# Patient Record
Sex: Female | Born: 1960 | Race: White | Hispanic: No | Marital: Married | Smoking: Former smoker
Health system: Southern US, Community
[De-identification: ages and names within clinical notes are randomized; demographics above are authoritative.]

## PROBLEM LIST (undated history)

## (undated) DIAGNOSIS — I4891 Unspecified atrial fibrillation: Secondary | ICD-10-CM

## (undated) HISTORY — PX: CHOLECYSTECTOMY: SHX55

---

## 2014-08-13 ENCOUNTER — Emergency Department (HOSPITAL_COMMUNITY)
Admission: EM | Admit: 2014-08-13 | Discharge: 2014-08-13 | Disposition: A | Discharge status: Home / Self Care | Payer: Self-pay | Attending: Emergency Medicine | Admitting: Emergency Medicine

## 2014-08-13 ENCOUNTER — Emergency Department (HOSPITAL_COMMUNITY): Payer: Self-pay

## 2014-08-13 ENCOUNTER — Encounter (HOSPITAL_COMMUNITY): Payer: Self-pay

## 2014-08-13 DIAGNOSIS — Z87891 Personal history of nicotine dependence: Secondary | ICD-10-CM | POA: Insufficient documentation

## 2014-08-13 DIAGNOSIS — I4891 Unspecified atrial fibrillation: Secondary | ICD-10-CM | POA: Insufficient documentation

## 2014-08-13 DIAGNOSIS — Z7982 Long term (current) use of aspirin: Secondary | ICD-10-CM | POA: Insufficient documentation

## 2014-08-13 DIAGNOSIS — Z79899 Other long term (current) drug therapy: Secondary | ICD-10-CM | POA: Insufficient documentation

## 2014-08-13 HISTORY — DX: Unspecified atrial fibrillation: I48.91

## 2014-08-13 LAB — CBC WITH DIFFERENTIAL/PLATELET
Basophils Absolute: 0 10*3/uL (ref 0.0–0.1)
Basophils Relative: 1 % (ref 0–1)
EOS ABS: 0.2 10*3/uL (ref 0.0–0.7)
EOS PCT: 3 % (ref 0–5)
HCT: 43.5 % (ref 36.0–46.0)
Hemoglobin: 14.3 g/dL (ref 12.0–15.0)
LYMPHS ABS: 4 10*3/uL (ref 0.7–4.0)
LYMPHS PCT: 55 % — AB (ref 12–46)
MCH: 30.5 pg (ref 26.0–34.0)
MCHC: 32.9 g/dL (ref 30.0–36.0)
MCV: 92.8 fL (ref 78.0–100.0)
MONO ABS: 0.5 10*3/uL (ref 0.1–1.0)
MONOS PCT: 7 % (ref 3–12)
Neutro Abs: 2.4 10*3/uL (ref 1.7–7.7)
Neutrophils Relative %: 34 % — ABNORMAL LOW (ref 43–77)
Platelets: 310 10*3/uL (ref 150–400)
RBC: 4.69 MIL/uL (ref 3.87–5.11)
RDW: 13.6 % (ref 11.5–15.5)
WBC: 7.2 10*3/uL (ref 4.0–10.5)

## 2014-08-13 LAB — COMPREHENSIVE METABOLIC PANEL
ALT: 26 U/L (ref 14–54)
AST: 38 U/L (ref 15–41)
Albumin: 3.6 g/dL (ref 3.5–5.0)
Alkaline Phosphatase: 77 U/L (ref 38–126)
Anion gap: 6 (ref 5–15)
BUN: 10 mg/dL (ref 6–20)
CHLORIDE: 105 mmol/L (ref 101–111)
CO2: 29 mmol/L (ref 22–32)
CREATININE: 0.86 mg/dL (ref 0.44–1.00)
Calcium: 9.2 mg/dL (ref 8.9–10.3)
GFR calc Af Amer: >60 mL/min (ref >60)
Glucose, Bld: 105 mg/dL — ABNORMAL HIGH (ref 65–99)
Potassium: 4.4 mmol/L (ref 3.5–5.1)
Sodium: 140 mmol/L (ref 135–145)
Total Bilirubin: 0.7 mg/dL (ref 0.3–1.2)
Total Protein: 6.7 g/dL (ref 6.5–8.1)

## 2014-08-13 LAB — TROPONIN I: Troponin I: <0.03 ng/mL (ref <0.031)

## 2014-08-13 MED ORDER — SODIUM CHLORIDE 0.9 % IV BOLUS (SEPSIS)
500.0000 mL | Freq: Once | INTRAVENOUS | Status: AC
  Administered 2014-08-13: 500 mL via INTRAVENOUS

## 2014-08-13 MED ORDER — METOPROLOL TARTRATE 1 MG/ML IV SOLN
5.0000 mg | Freq: Once | INTRAVENOUS | Status: AC
  Administered 2014-08-13: 5 mg via INTRAVENOUS
  Filled 2014-08-13: qty 5

## 2014-08-13 NOTE — Discharge Instructions (Signed)
Follow-up with your cardiologist in the next 3 days. Take atenolol 50 mg twice daily. Return to the ER with any worsening of symptoms, severe chest pain, shortness of breath, lightheadedness or if you feel your going to pass out.  Atrial Fibrillation Atrial fibrillation is a type of irregular heart rhythm (arrhythmia). During atrial fibrillation, the upper chambers of the heart (atria) quiver continuously in a chaotic pattern. This causes an irregular and often rapid heart rate.  Atrial fibrillation is the result of the heart becoming overloaded with disorganized signals that tell it to beat. These signals are normally released one at a time by a part of the right atrium called the sinoatrial node. They then travel from the atria to the lower chambers of the heart (ventricles), causing the atria and ventricles to contract and pump blood as they pass. In atrial fibrillation, parts of the atria outside of the sinoatrial node also release these signals. This results in two problems. First, the atria receive so many signals that they do not have time to fully contract. Second, the ventricles, which can only receive one signal at a time, beat irregularly and out of rhythm with the atria.  There are three types of atrial fibrillation:   Paroxysmal. Paroxysmal atrial fibrillation starts suddenly and stops on its own within a week.  Persistent. Persistent atrial fibrillation lasts for more than a week. It may stop on its own or with treatment.  Permanent. Permanent atrial fibrillation does not go away. Episodes of atrial fibrillation may lead to permanent atrial fibrillation. Atrial fibrillation can prevent your heart from pumping blood normally. It increases your risk of stroke and can lead to heart failure.  CAUSES   Heart conditions, including a heart attack, heart failure, coronary artery disease, and heart valve conditions.   Inflammation of the sac that surrounds the heart (pericarditis).  Blockage  of an artery in the lungs (pulmonary embolism).  Pneumonia or other infections.  Chronic lung disease.  Thyroid problems, especially if the thyroid is overactive (hyperthyroidism).  Caffeine, excessive alcohol use, and use of some illegal drugs.   Use of some medicines, including certain decongestants and diet pills.  Heart surgery.   Birth defects.  Sometimes, no cause can be found. When this happens, the atrial fibrillation is called lone atrial fibrillation. The risk of complications from atrial fibrillation increases if you have lone atrial fibrillation and you are age 37 years or older. RISK FACTORS  Heart failure.  Coronary artery disease.  Diabetes mellitus.   High blood pressure (hypertension).   Obesity.   Other arrhythmias.   Increased age. SIGNS AND SYMPTOMS   A feeling that your heart is beating rapidly or irregularly.   A feeling of discomfort or pain in your chest.   Shortness of breath.   Sudden light-headedness or weakness.   Getting tired easily when exercising.   Urinating more often than normal (mainly when atrial fibrillation first begins).  In paroxysmal atrial fibrillation, symptoms may start and suddenly stop. DIAGNOSIS  Your health care provider may be able to detect atrial fibrillation when taking your pulse. Your health care provider may have you take a test called an ambulatory electrocardiogram (ECG). An ECG records your heartbeat patterns over a 24-hour period. You may also have other tests, such as:  Transthoracic echocardiogram (TTE). During echocardiography, sound waves are used to evaluate how blood flows through your heart.  Transesophageal echocardiogram (TEE).  Stress test. There is more than one type of stress test. If a  stress test is needed, ask your health care provider about which type is best for you.  Chest X-ray exam.  Blood tests.  Computed tomography (CT). TREATMENT  Treatment may  include:  Treating any underlying conditions. For example, if you have an overactive thyroid, treating the condition may correct atrial fibrillation.  Taking medicine. Medicines may be given to control a rapid heart rate or to prevent blood clots, heart failure, or a stroke.  Having a procedure to correct the rhythm of the heart:  Electrical cardioversion. During electrical cardioversion, a controlled, low-energy shock is delivered to the heart through your skin. If you have chest pain, very low blood pressure, or sudden heart failure, this procedure may need to be done as an emergency.  Catheter ablation. During this procedure, heart tissues that send the signals that cause atrial fibrillation are destroyed.  Surgical ablation. During this surgery, thin lines of heart tissue that carry the abnormal signals are destroyed. This procedure can either be an open-heart surgery or a minimally invasive surgery. With the minimally invasive surgery, small cuts are made to access the heart instead of a large opening.  Pulmonary venous isolation. During this surgery, tissue around the veins that carry blood from the lungs (pulmonary veins) is destroyed. This tissue is thought to carry the abnormal signals. HOME CARE INSTRUCTIONS   Take medicines only as directed by your health care provider. Some medicines can make atrial fibrillation worse or recur.  If blood thinners were prescribed by your health care provider, take them exactly as directed. Too much blood-thinning medicine can cause bleeding. If you take too little, you will not have the needed protection against stroke and other problems.  Perform blood tests at home if directed by your health care provider. Perform blood tests exactly as directed.  Quit smoking if you smoke.  Do not drink alcohol.  Do not drink caffeinated beverages such as coffee, soda, and some teas. You may drink decaffeinated coffee, soda, or tea.   Maintain a healthy  weight.Do not use diet pills unless your health care provider approves. They may make heart problems worse.   Follow diet instructions as directed by your health care provider.  Exercise regularly as directed by your health care provider.  Keep all follow-up visits as directed by your health care provider. This is important. PREVENTION  The following substances can cause atrial fibrillation to recur:   Caffeinated beverages.  Alcohol.  Certain medicines, especially those used for breathing problems.  Certain herbs and herbal medicines, such as those containing ephedra or ginseng.  Illegal drugs, such as cocaine and amphetamines. Sometimes medicines are given to prevent atrial fibrillation from recurring. Proper treatment of any underlying condition is also important in helping prevent recurrence.  SEEK MEDICAL CARE IF:  You notice a change in the rate, rhythm, or strength of your heartbeat.  You suddenly begin urinating more frequently.  You tire more easily when exerting yourself or exercising. SEEK IMMEDIATE MEDICAL CARE IF:   You have chest pain, abdominal pain, sweating, or weakness.  You feel nauseous.  You have shortness of breath.  You suddenly have swollen feet and ankles.  You feel dizzy.  Your face or limbs feel numb or weak.  You have a change in your vision or speech. MAKE SURE YOU:   Understand these instructions.  Will watch your condition.  Will get help right away if you are not doing well or get worse. Document Released: 12/30/2004 Document Revised: 05/16/2013 Document Reviewed: 02/10/2012  [employer]® Patient Information ©2015 [employer]. This information is not intended to replace advice given to you by your health care provider. Make sure you discuss any questions you have with your health care provider. ° °

## 2014-08-13 NOTE — ED Notes (Signed)
Pt c/o irregular heartbeat x 1 week. Hx a fib - takes atenolol and  aspirin. Pt has had symptoms of dizziness/weakness all week.

## 2014-08-13 NOTE — ED Provider Notes (Signed)
CSN: [id number]     Arrival date & time 08/13/14  0533 History   First MD Initiated Contact with Patient 08/13/14 [phone]     Chief Complaint  Patient presents with  . Atrial Fibrillation     (Consider location/radiation/quality/duration/timing/severity/associated sxs/prior Treatment) HPI Patient is a 54 year old female with past medical history of atrial fibrillation who presents the ER complaining of intermittent irregular heartbeat. He states for the past 2 weeks she has been experiencing intermittent tachycardia, with associated mild chest discomfort, lightheadedness, shortness of breath. Patient states these episodes are consistent with episodes of atrial fibrillation she has had in the past. Patient said she has never had paroxysmal atrial fibrillation similar this before. She states she has had been converted pharmacologically with her previous episodes. Patient states she's been taking atenolol 25 mg twice daily for several years. Patient denies any aggravating or alleviating factors of these episodes. She reports the episodes last anywhere from 2-3 minutes to approximately one hour. Currently during my interview and exam patient denies of having any complaints.  Past Medical History  Diagnosis Date  . Atrial fibrillation    Past Surgical History  Procedure Laterality Date  . Cholecystectomy     History reviewed. No pertinent family history. History  Substance Use Topics  . Smoking status: Former Smoker    Quit date: 01/14/1988  . Smokeless tobacco: Not on file  . Alcohol Use: No   OB History    No data available     Review of Systems  Constitutional: Negative for fever.  HENT: Negative for trouble swallowing.   Eyes: Negative for visual disturbance.  Respiratory: Positive for shortness of breath.   Cardiovascular: Positive for chest pain.  Gastrointestinal: Negative for nausea, vomiting and abdominal pain.  Genitourinary: Negative for dysuria.  Musculoskeletal: Negative  for neck pain.  Skin: Negative for rash.  Neurological: Positive for light-headedness. Negative for dizziness, weakness and numbness.  Psychiatric/Behavioral: Negative.       Allergies  Review of patient's allergies indicates no known allergies.  Home Medications   Prior to Admission medications   Medication Sig Start Date End Date Taking? Authorizing Provider  aspirin EC 81 MG tablet Take 162 mg by mouth daily.   Yes Historical Provider, MD  atenolol (TENORMIN) 25 MG tablet Take 25 mg by mouth 2 (two) times daily.   Yes Historical Provider, MD  cyclobenzaprine (FLEXERIL) 10 MG tablet Take 10 mg by mouth 2 (two) times daily.   Yes Historical Provider, MD  diphenhydrAMINE (BENADRYL) 50 MG tablet Take 50 mg by mouth at bedtime.   Yes Historical Provider, MD  DOCUSATE SODIUM PO Take 1 capsule by mouth at bedtime.   Yes Historical Provider, MD  gabapentin (NEURONTIN) 300 MG capsule Take 300 mg by mouth 3 (three) times daily.   Yes Historical Provider, MD  omeprazole (PRILOSEC) 20 MG capsule Take 20 mg by mouth 2 (two) times daily before a meal.   Yes Historical Provider, MD  Oxycodone HCl 10 MG TABS Take 10 mg by mouth 4 (four) times daily.   Yes Historical Provider, MD  Probiotic Product (PROBIOTIC PO) Take 1 tablet by mouth daily.   Yes Historical Provider, MD   BP 111/49 mmHg  Pulse 66  Temp(Src) 97.6 F (36.4 C) (Oral)  Resp 15  Ht 5' 5.5" (1.664 m)  Wt 335 lb (151.955 kg)  BMI 54.88 kg/m2  SpO2 97% Physical Exam  Constitutional: She is oriented to person, place, and time. She appears well-developed and well-nourished.  No distress.  HENT:  Head: Normocephalic and atraumatic.  Mouth/Throat: Oropharynx is clear and moist. No oropharyngeal exudate.  Eyes: Right eye exhibits no discharge. Left eye exhibits no discharge. No scleral icterus.  Neck: Normal range of motion.  Cardiovascular: Normal rate, regular rhythm and normal heart sounds.   No murmur heard. Pulmonary/Chest:  Effort normal and breath sounds normal. No respiratory distress.  Abdominal: Soft. There is no tenderness.  Musculoskeletal: Normal range of motion. She exhibits no edema or tenderness.  Neurological: She is alert and oriented to person, place, and time. No cranial nerve deficit. Coordination normal.  Skin: Skin is warm and dry. No rash noted. She is not diaphoretic.  Psychiatric: She has a normal mood and affect.    ED Course  Procedures (including critical care time) Labs Review Labs Reviewed  CBC WITH DIFFERENTIAL/PLATELET - Abnormal; Notable for the following:    Neutrophils Relative % 34 (*)    Lymphocytes Relative 55 (*)    All other components within normal limits  COMPREHENSIVE METABOLIC PANEL - Abnormal; Notable for the following:    Glucose, Bld 105 (*)    All other components within normal limits  TROPONIN I    Imaging Review Dg Chest 2 View  08/13/2014   CLINICAL DATA:  Acute shortness of breath and atrial fibrillation.  EXAM: CHEST  2 VIEW  COMPARISON:  None.  FINDINGS: Upper limits normal heart size noted.  There is no evidence of focal airspace disease, pulmonary edema, suspicious pulmonary nodule/mass, pleural effusion, or pneumothorax. No acute bony abnormalities are identified.  IMPRESSION: No active cardiopulmonary disease.   Electronically Signed   By: Harmon Pier M.D.   On: 08/13/2014 07:13     EKG Interpretation   Date/Time:  Sunday August 13 2014 05:42:36 EDT Ventricular Rate:  82 PR Interval:  170 QRS Duration: 86 QT Interval:  334 QTC Calculation: 390 R Axis:   57 Text Interpretation:  Normal sinus rhythm Low voltage QRS Borderline ECG  No old tracing to compare Confirmed by CAMPOS  MD, Caryn Bee ([id number]) on  08/13/2014 6:25:55 AM      MDM   Final diagnoses:  Atrial fibrillation    Patient with only 1 brief episode of atrial fibrillation which lasts several minutes here. Patient given Lopressor, which seems to have Patient maintained in a sinus  rhythm. Patient converts spontaneously without intervention. With these irregular, transient episodes of atrial fibrillation, likely patient will respond well to increasing beta beta blocker. Patient has no evidence of acute pathology on lab work here. Troponin negative greater than 6 hours after onset of symptoms. Chest radiograph without evidence of acute pathology. EKG shows sinus rhythm without evidence of acute injury or ectopy. Do not see any evidence of acute pathology, infectious, metabolic causes of patient's atrial fibrillation. Patient afebrile, hemodynamically stable and in no acute distress. Patient stable for discharge. Encouraged patient to increase atenolol dose to 50 mg twice a day, encouraged close follow-up with cardiology in the next several days. Return precautions discussed with patient, she verbalizes understanding and agreement of this plan.  BP 111/49 mmHg  Pulse 66  Temp(Src) 97.6 F (36.4 C) (Oral)  Resp 15  Ht 5' 5.5" (1.664 m)  Wt 335 lb (151.955 kg)  BMI 54.88 kg/m2  SpO2 97%  Signed,  Ladona Mow, PA-C 2:05 PM  Patient seen and discussed with Dr. Azalia Bilis, M.D.  Ladona Mow, PA-C 08/13/14 1405  Azalia Bilis, MD 08/14/14 [phone]

## 2016-09-28 IMAGING — CR DG CHEST 2V
2 series · 2 of 2 positions shown · non-contrast
Comparison: None.

CLINICAL DATA: Acute shortness of breath and atrial fibrillation.

EXAM:
CHEST  2 VIEW

[chest pa]
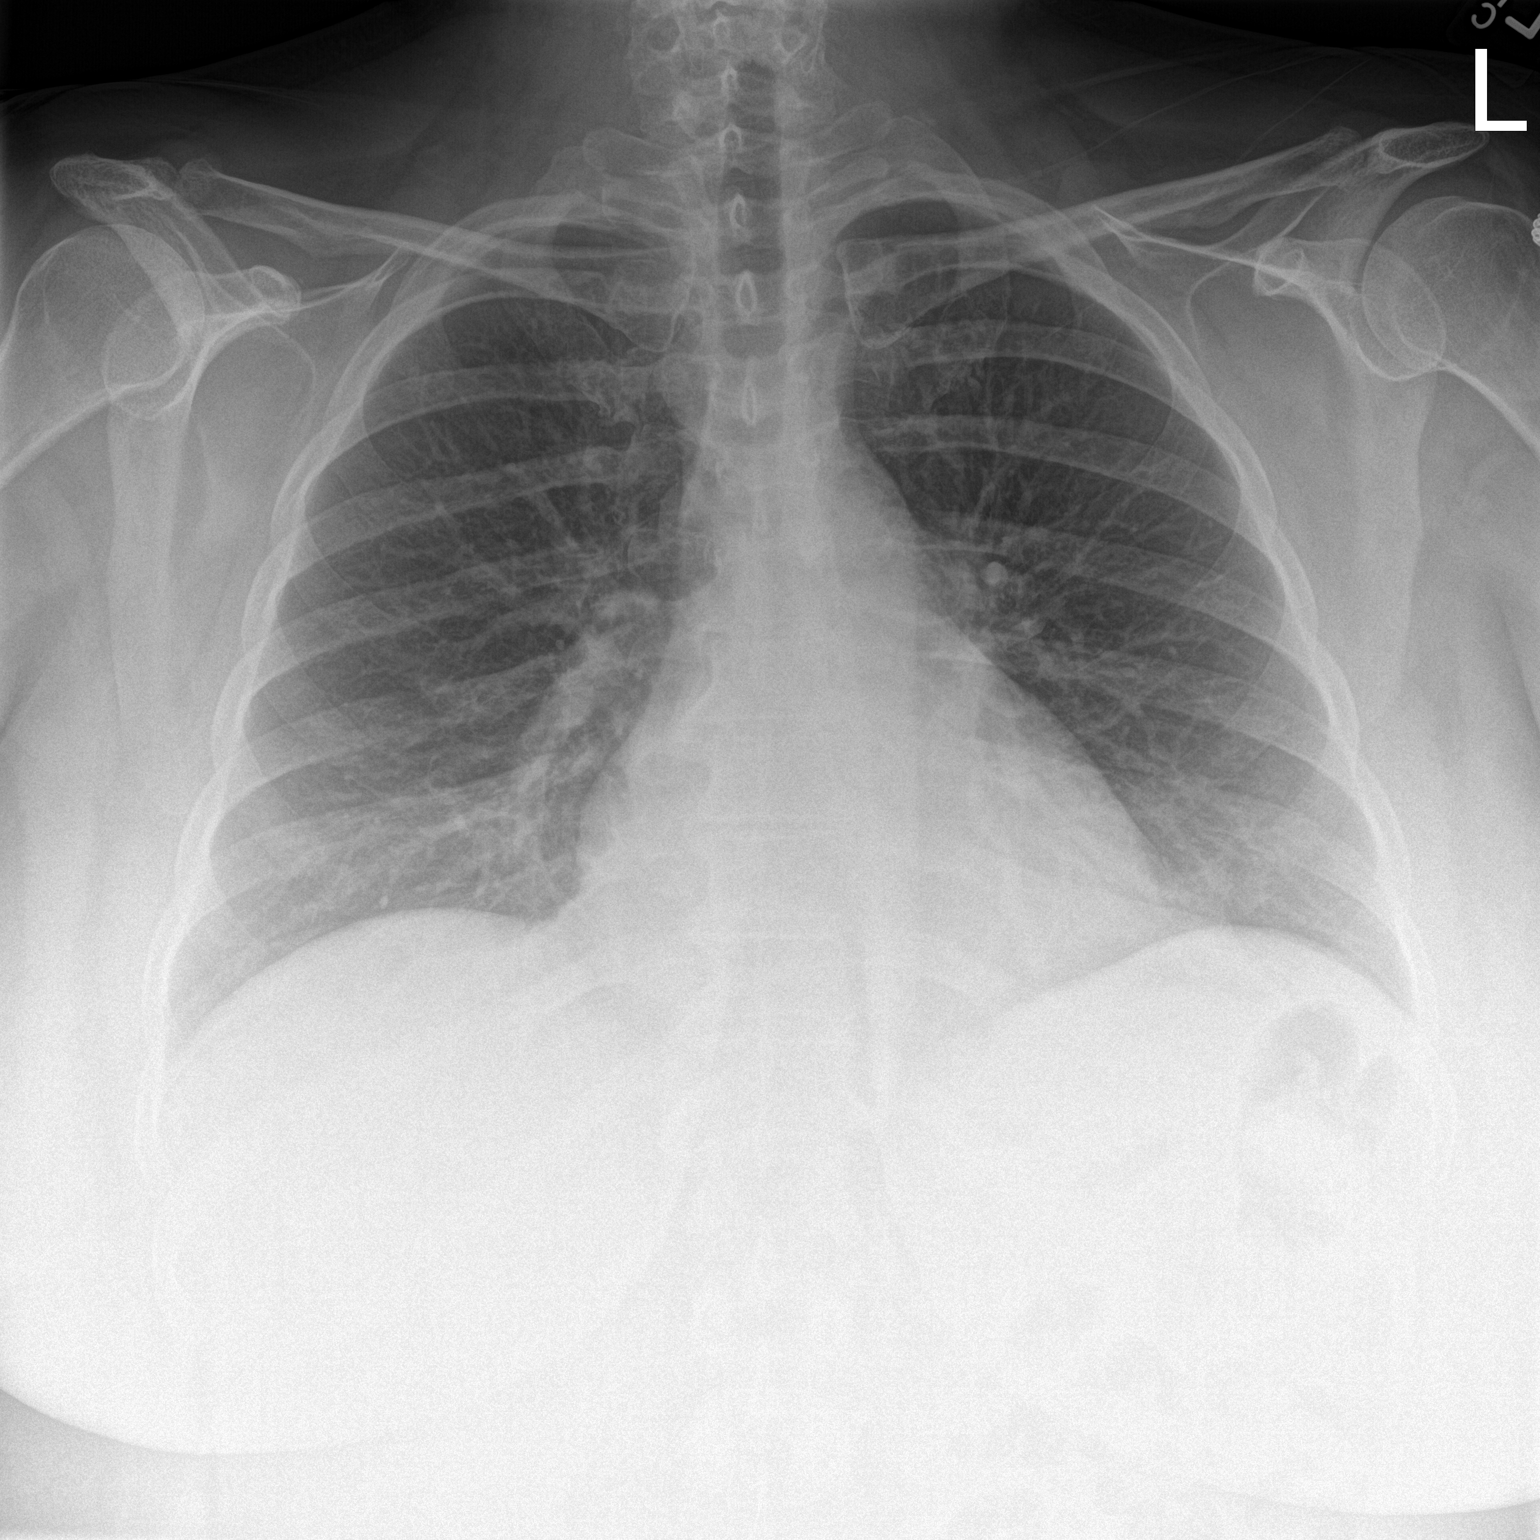

[chest lat]
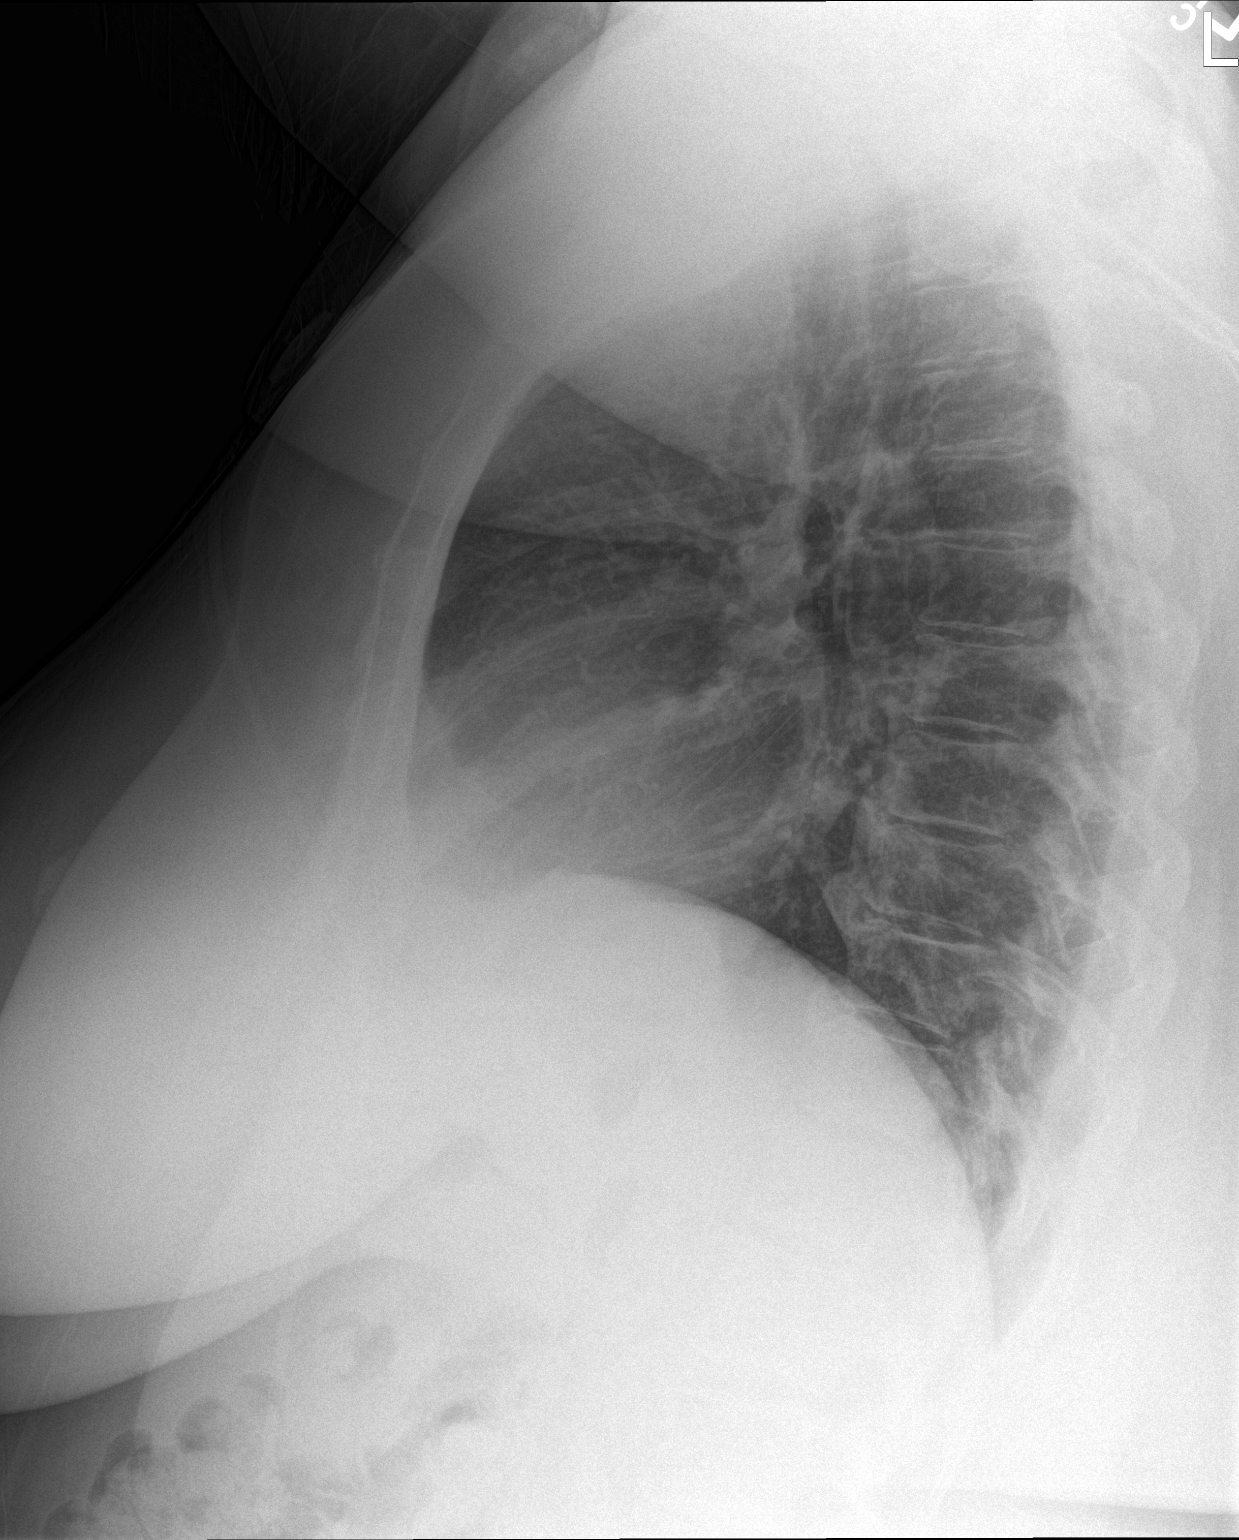

[2 of 2 positions shown; findings below may reference images not displayed]

FINDINGS: Upper limits normal heart size noted.

There is no evidence of focal airspace disease, pulmonary edema,
suspicious pulmonary nodule/mass, pleural effusion, or pneumothorax.
No acute bony abnormalities are identified.
IMPRESSION: No active cardiopulmonary disease.
